# Patient Record
Sex: Male | Born: 1995 | Race: Black or African American | Hispanic: No | Marital: Single | Smoking: Former smoker
Health system: Southern US, Community
[De-identification: ages and names within clinical notes are randomized; demographics above are authoritative.]

---
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
## 2013-03-08 IMAGING — RF DG SHOULDER 2+V*R*
1 series · 4 of 4 positions shown · non-contrast
Comparison: 02/13/2012

CLINICAL DATA: AC joint fixation, instability

RIGHT SHOULDER - 2+ VIEW

[Series 1: run · 4 of 4 slices shown]
[im 1/4]
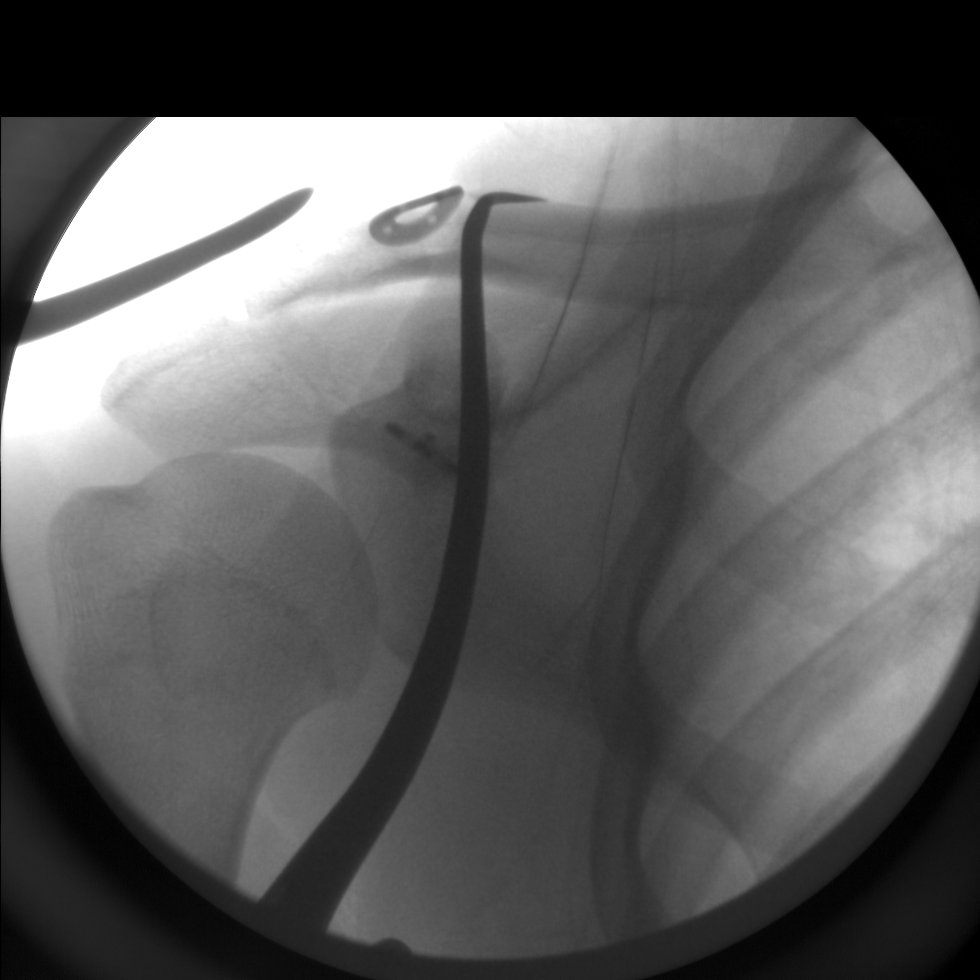
[im 2/4]
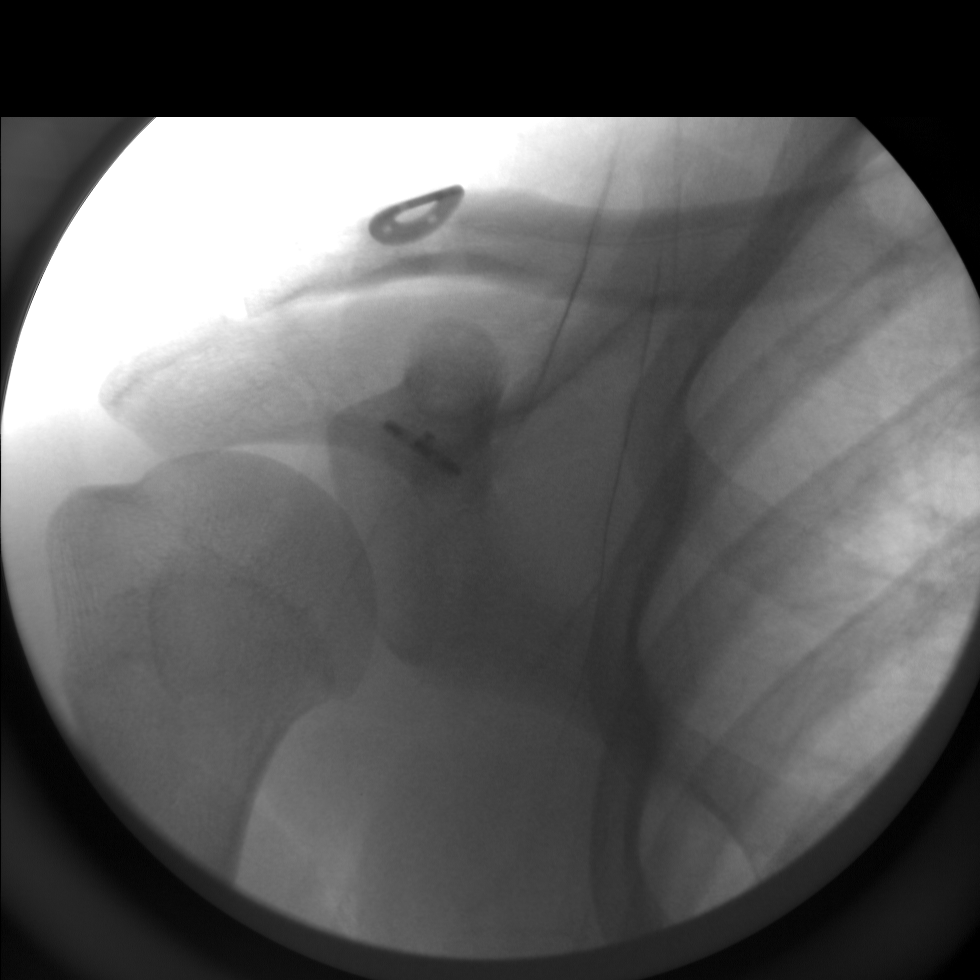
[im 3/4]
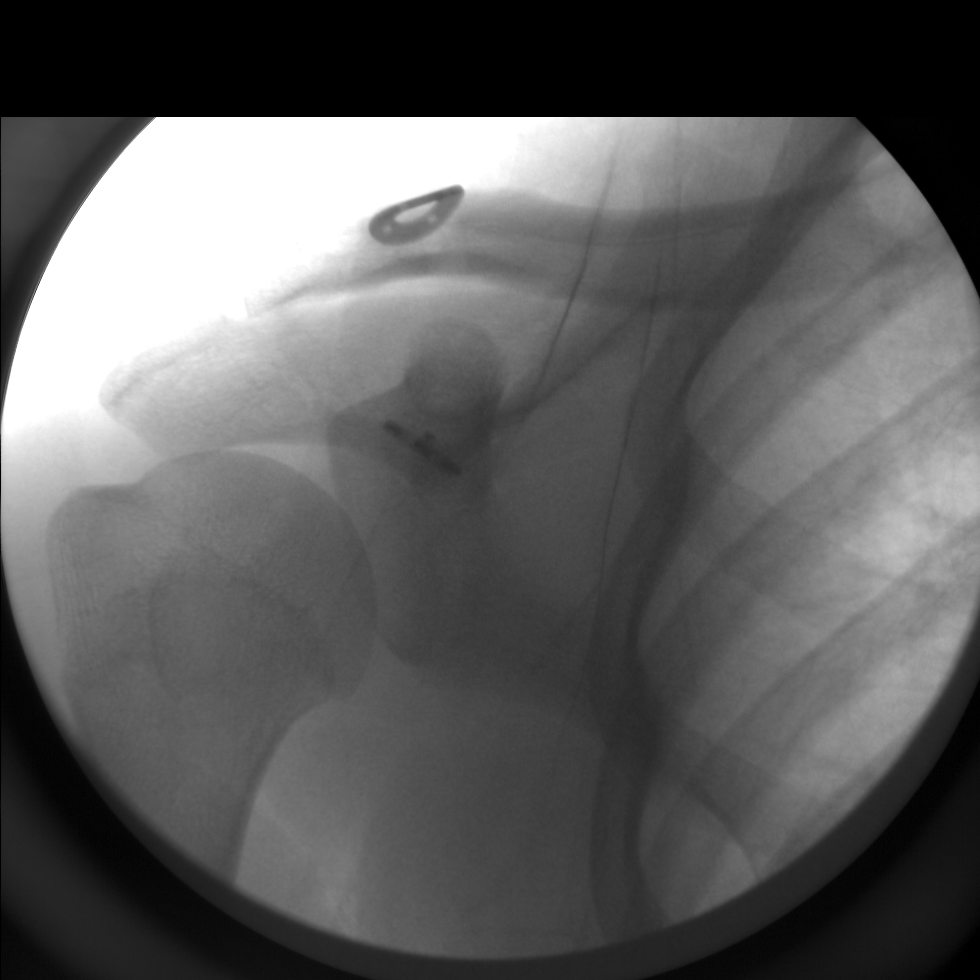
[im 4/4]
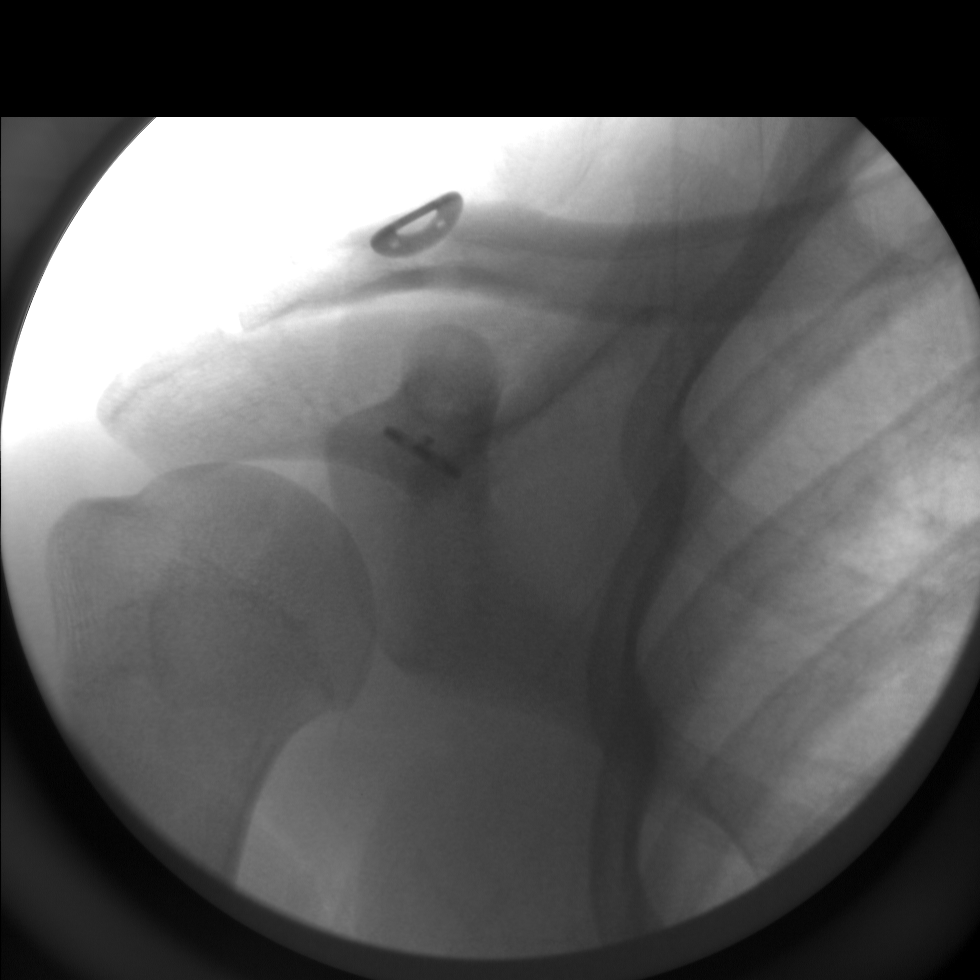

[4 of 4 positions shown; findings below may reference images not displayed]

FINDINGS: Spot fluoroscopic intraoperative views demonstrate
fixation of the distal clavicle to the coracoid process.  AC joint
appears aligned.
IMPRESSION: Limited intraoperative imaging during AC joint stabilization.  AC
joint appears aligned.  No acute fracture.

## 2013-03-08 IMAGING — CR DG SHOULDER 2+V PORT*R*
1 series · 1 of 1 positions shown · non-contrast
Comparison: 02/13/2012

CLINICAL DATA: Postop right AC joint fixation

PORTABLE RIGHT SHOULDER - 2+ VIEW

[view not recorded]
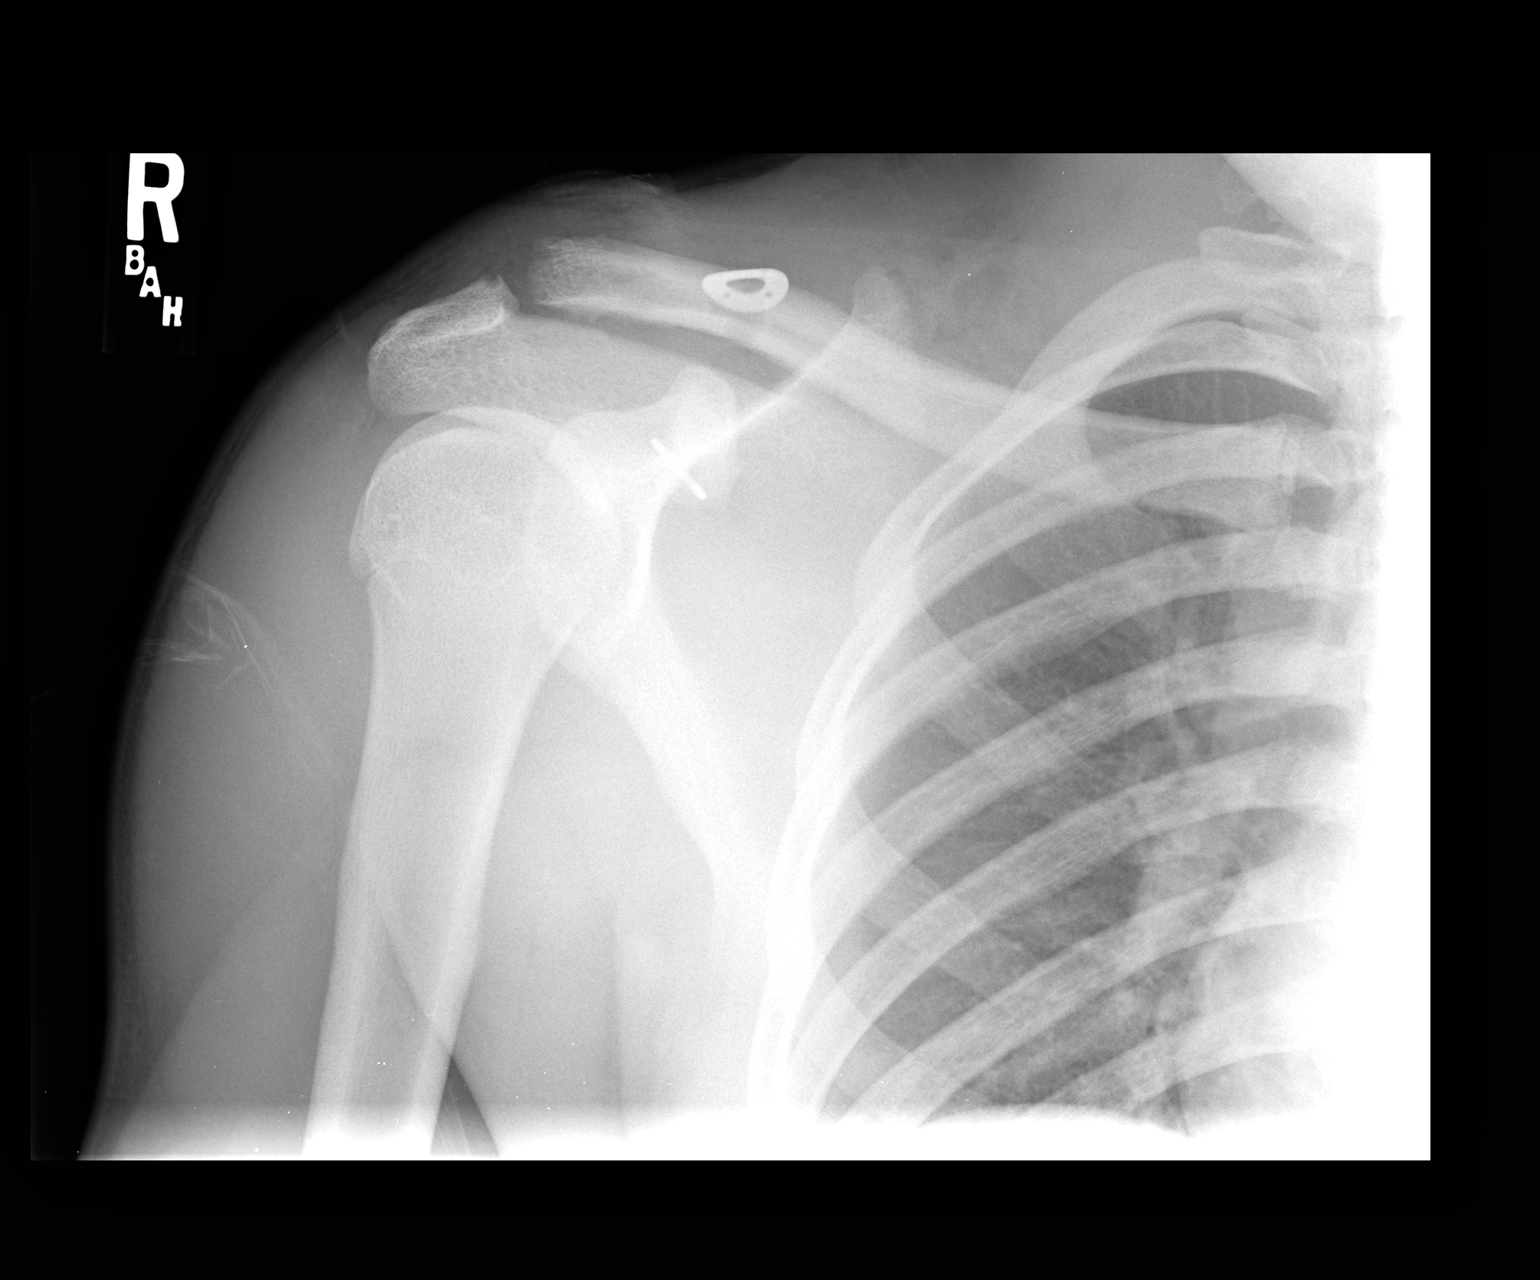

[1 of 1 positions shown; findings below may reference images not displayed]

FINDINGS: Postop changes noted of the right AC joint.  Hardware
noted traversing the distal clavicle to the coracoid process.
Normal developmental changes.  No fracture evident.  Mild soft
tissue swelling overlying the AC joint.
IMPRESSION: Expected postoperative appearance status post AC joint
stabilization.

## 2013-03-08 IMAGING — CR DG CHEST 2V
2 series · 2 of 2 positions shown · non-contrast
Comparison: None

CLINICAL DATA: Preop right shoulder surgery.

CHEST - 2 VIEW

[view not recorded (1 of 2)]
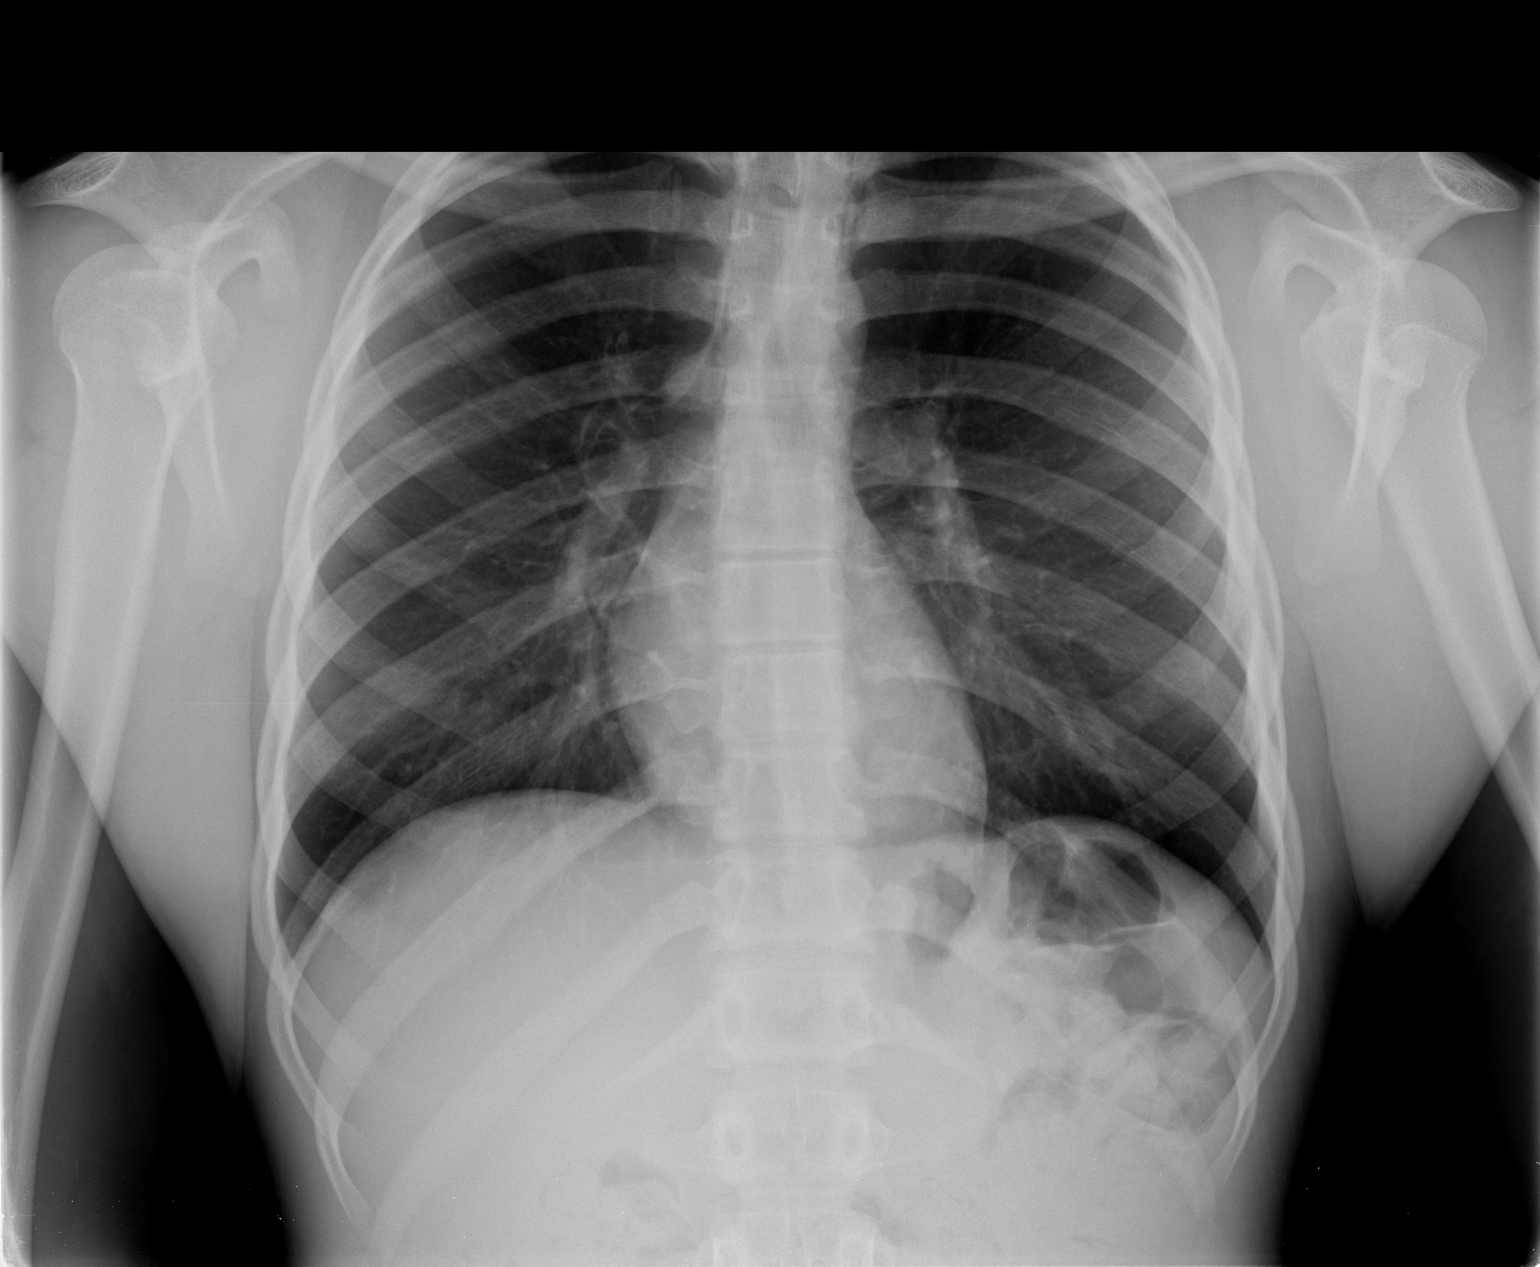

[view not recorded (2 of 2)]
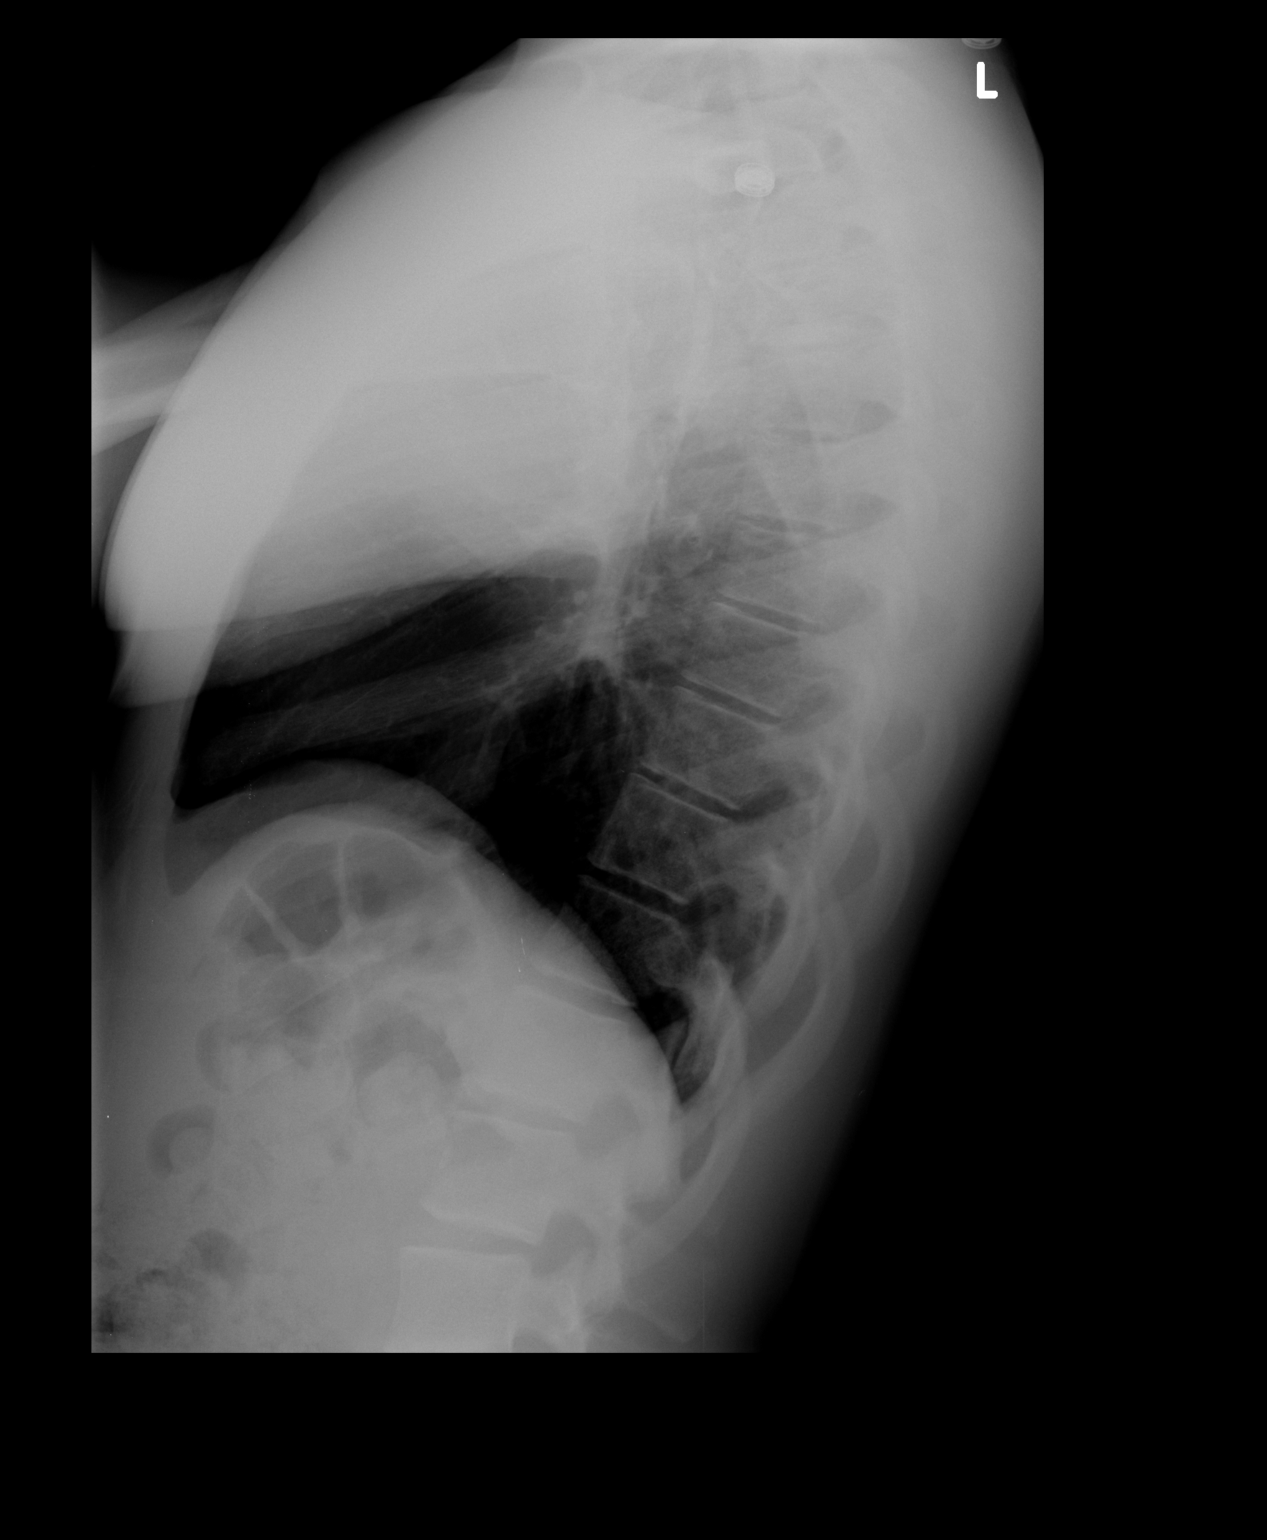

[2 of 2 positions shown; findings below may reference images not displayed]

FINDINGS: Heart and mediastinal contours are within normal limits.
No focal opacities or effusions.  No acute bony abnormality.
IMPRESSION: No active cardiopulmonary disease.

## 2016-05-10 ENCOUNTER — Emergency Department (HOSPITAL_COMMUNITY)
Admission: EM | Admit: 2016-05-10 | Discharge: 2016-05-10 | Disposition: A | Discharge status: Home / Self Care | Payer: BLUE CROSS/BLUE SHIELD | Attending: Emergency Medicine | Admitting: Emergency Medicine

## 2016-05-10 ENCOUNTER — Emergency Department (HOSPITAL_COMMUNITY): Payer: BLUE CROSS/BLUE SHIELD

## 2016-05-10 ENCOUNTER — Encounter (HOSPITAL_COMMUNITY): Payer: Self-pay | Admitting: Emergency Medicine

## 2016-05-10 DIAGNOSIS — T07XXXA Unspecified multiple injuries, initial encounter: Secondary | ICD-10-CM

## 2016-05-10 DIAGNOSIS — S60417A Abrasion of left little finger, initial encounter: Secondary | ICD-10-CM | POA: Insufficient documentation

## 2016-05-10 DIAGNOSIS — S60412A Abrasion of right middle finger, initial encounter: Secondary | ICD-10-CM | POA: Insufficient documentation

## 2016-05-10 DIAGNOSIS — Y998 Other external cause status: Secondary | ICD-10-CM | POA: Insufficient documentation

## 2016-05-10 DIAGNOSIS — Y9289 Other specified places as the place of occurrence of the external cause: Secondary | ICD-10-CM | POA: Insufficient documentation

## 2016-05-10 DIAGNOSIS — M79643 Pain in unspecified hand: Secondary | ICD-10-CM

## 2016-05-10 DIAGNOSIS — Z87891 Personal history of nicotine dependence: Secondary | ICD-10-CM | POA: Insufficient documentation

## 2016-05-10 DIAGNOSIS — S60221A Contusion of right hand, initial encounter: Secondary | ICD-10-CM

## 2016-05-10 DIAGNOSIS — Y9389 Activity, other specified: Secondary | ICD-10-CM | POA: Insufficient documentation

## 2016-05-10 MED ORDER — HYDROCODONE-ACETAMINOPHEN 5-325 MG PO TABS: 1.0000 | ORAL_TABLET | Freq: Four times a day (QID) | ORAL | Status: AC | PRN

## 2016-05-10 MED ORDER — NAPROXEN 500 MG PO TABS: 500.0000 mg | ORAL_TABLET | Freq: Two times a day (BID) | ORAL | Status: AC | PRN

## 2016-05-10 MED ORDER — AMOXICILLIN-POT CLAVULANATE 875-125 MG PO TABS: 1.0000 | ORAL_TABLET | Freq: Two times a day (BID) | ORAL | Status: AC

## 2016-05-10 MED ORDER — AMOXICILLIN-POT CLAVULANATE 875-125 MG PO TABS
1.0000 | ORAL_TABLET | Freq: Once | ORAL | Status: AC
  Administered 2016-05-10: 1 via ORAL
  Filled 2016-05-10: qty 1

## 2016-05-10 MED ORDER — BACITRACIN ZINC 500 UNIT/GM EX OINT
1.0000 "application " | TOPICAL_OINTMENT | Freq: Two times a day (BID) | CUTANEOUS | Status: DC
  Administered 2016-05-10: 1 via TOPICAL
  Filled 2016-05-10: qty 1.8

## 2016-05-10 MED ORDER — HYDROCODONE-ACETAMINOPHEN 5-325 MG PO TABS
2.0000 | ORAL_TABLET | Freq: Once | ORAL | Status: AC
  Administered 2016-05-10: 2 via ORAL
  Filled 2016-05-10: qty 2
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
## 2016-05-10 NOTE — Discharge Instructions (Signed)
Take Augmentin as prescribed until finished. Take Naproxen for pain. Ice your hands 3-4 times per day for 15-20 minutes each time. You may take Norco for severe pain. Follow up with a hand specialist for wound recheck if symptoms persist. You may return, as needed, if symptoms worsen.  Human Bite Human bite wounds tend to become infected, even when they seem minor at first. Infection can develop quickly, sometimes in a matter of hours. Bite wounds of the hand have a higher chance of infection compared to bites in other places and can be serious because the tendons and joints are close to the skin. SYMPTOMS  Common symptoms of a human bite include:   Bruising.   Broken skin.  Bleeding.  Pain. DIAGNOSIS  This condition may be diagnosed based on a physical exam and medical history. Your health care provider will examine the wound and ask for details about how the bite happened. If you have details about the medical history of the person who bit you, it is important to tell your health care provider. This will help determine if there is any chance that a disease may have been spread. You may have tests, such as:   Blood tests. This may be done if there is a chance of infection from diseases such as hepatitis or HIV.  X-rays to check for damage to bones or joints.  Culture test. This uses a sample of fluid from the wound to check for infection. TREATMENT  Treatment varies based on the location and severity of the bite and your medical history. Treatment may include:   Wound care. This often includes cleaning the wound, flushing the wound with saline solution, and applying a bandage (dressing). Sometimes, the wound is left open to heal because of the high risk of infection. However, in some cases, the wound may be closed with stitches (sutures), staples, skin glue, or adhesive strips.  Antibiotic medicine.  A flexible cast (splint).  Tetanus shot. In some cases, bites that have become  infected may require IV antibiotics and surgical treatment in the hospital.  HOME CARE INSTRUCTIONS Wound Care  Follow instructions from your health care provider about how to take care of your wound. Make sure you:  Wash your hands with soap and water before you change your dressing. If soap and water are not available, use hand sanitizer.  Change your dressing as told by your health care provider.  Leave sutures, skin glue, or adhesive strips in place. These skin closures may need to be in place for 2 weeks or longer. If adhesive strip edges start to loosen and curl up, you may trim the loose edges. Do not remove adhesive strips completely unless your health care provider tells you to do that.  Check your wound every day for signs of infection. Watch for:  Increasing redness, swelling, or pain.  Fluid, blood, or pus. General Instructions  Take or apply over-the-counter and prescription medicines only as told by your health care provider.  If you were prescribed an antibiotic, take or apply it as told by your health care provider. Do not stop using the antibiotic even if your condition improves.  Keep the injured area raised (elevated) above the level of your heart while you are sitting or lying down, if this is possible.  If directed, apply ice to the injured area:  Put ice in a plastic bag.  Place a towel between your skin and the bag.  Leave the ice on for 20 minutes, 2-3  times per day.  Keep all follow-up visits as told by your health care provider. This is important. SEEK MEDICAL CARE IF:  You have chills.   You have pain when you move your injured area.  You have trouble moving your injured area.   You are not improving, or you are getting worse.  SEEK IMMEDIATE MEDICAL CARE IF:  You have increasing fluid, blood, or pus coming from your wound.  You have increasing redness, swelling, or pain at the site of your wound.   You have a red streak extending  away from your wound.  You have a fever.    This information is not intended to replace advice given to you by your health care provider. Make sure you discuss any questions you have with your health care provider.   Document Released: 01/04/2005 Document Revised: 08/18/2015 Document Reviewed: 04/14/2015 Elsevier Interactive Patient Education 2016 [employer]   Cryotherapy Cryotherapy means treatment with cold. Ice or gel packs can be used to reduce both pain and swelling. Ice is the most helpful within the first 24 to 48 hours after an injury or flare-up from overusing a muscle or joint. Sprains, strains, spasms, burning pain, shooting pain, and aches can all be eased with ice. Ice can also be used when recovering from surgery. Ice is effective, has very few side effects, and is safe for most people to use. PRECAUTIONS  Ice is not a safe treatment option for people with:  Raynaud phenomenon. This is a condition affecting small blood vessels in the extremities. Exposure to cold may cause your problems to return.  Cold hypersensitivity. There are many forms of cold hypersensitivity, including:  Cold urticaria. Red, itchy hives appear on the skin when the tissues begin to warm after being iced.  Cold erythema. This is a red, itchy rash caused by exposure to cold.  Cold hemoglobinuria. Red blood cells break down when the tissues begin to warm after being iced. The hemoglobin that carry oxygen are passed into the urine because they cannot combine with blood proteins fast enough.  Numbness or altered sensitivity in the area being iced. If you have any of the following conditions, do not use ice until you have discussed cryotherapy with your caregiver:  Heart conditions, such as arrhythmia, angina, or chronic heart disease.  High blood pressure.  Healing wounds or open skin in the area being iced.  Current infections.  Rheumatoid arthritis.  Poor circulation.  Diabetes. Ice  slows the blood flow in the region it is applied. This is beneficial when trying to stop inflamed tissues from spreading irritating chemicals to surrounding tissues. However, if you expose your skin to cold temperatures for too long or without the proper protection, you can damage your skin or nerves. Watch for signs of skin damage due to cold. HOME CARE INSTRUCTIONS Follow these tips to use ice and cold packs safely.  Place a dry or damp towel between the ice and skin. A damp towel will cool the skin more quickly, so you may need to shorten the time that the ice is used.  For a more rapid response, add gentle compression to the ice.  Ice for no more than 10 to 20 minutes at a time. The bonier the area you are icing, the less time it will take to get the benefits of ice.  Check your skin after 5 minutes to make sure there are no signs of a poor response to cold or skin damage.  Rest 20  minutes or more between uses.  Once your skin is numb, you can end your treatment. You can test numbness by very lightly touching your skin. The touch should be so light that you do not see the skin dimple from the pressure of your fingertip. When using ice, most people will feel these normal sensations in this order: cold, burning, aching, and numbness.  Do not use ice on someone who cannot communicate their responses to pain, such as small children or people with dementia. HOW TO MAKE AN ICE PACK Ice packs are the most common way to use ice therapy. Other methods include ice massage, ice baths, and cryosprays. Muscle creams that cause a cold, tingly feeling do not offer the same benefits that ice offers and should not be used as a substitute unless recommended by your caregiver. To make an ice pack, do one of the following:  Place crushed ice or a bag of frozen vegetables in a sealable plastic bag. Squeeze out the excess air. Place this bag inside another plastic bag. Slide the bag into a pillowcase or place a  damp towel between your skin and the bag.  Mix 3 parts water with 1 part rubbing alcohol. Freeze the mixture in a sealable plastic bag. When you remove the mixture from the freezer, it will be slushy. Squeeze out the excess air. Place this bag inside another plastic bag. Slide the bag into a pillowcase or place a damp towel between your skin and the bag. SEEK MEDICAL CARE IF:  You develop white spots on your skin. This may give the skin a blotchy (mottled) appearance.  Your skin turns blue or pale.  Your skin becomes waxy or hard.  Your swelling gets worse. MAKE SURE YOU:   Understand these instructions.  Will watch your condition.  Will get help right away if you are not doing well or get worse.   This information is not intended to replace advice given to you by your health care provider. Make sure you discuss any questions you have with your health care provider.   Document Released: 07/24/2011 Document Revised: 12/18/2014 Document Reviewed: 07/24/2011 Elsevier Interactive Patient Education [employer]
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
## 2017-01-10 ENCOUNTER — Telehealth (INDEPENDENT_AMBULATORY_CARE_PROVIDER_SITE_OTHER): Payer: Self-pay | Admitting: Orthopedic Surgery

## 2017-01-10 NOTE — Telephone Encounter (Signed)
Patient called left message requesting a copy of records.  I called him back @ [phone] spoke with him. He needs his records mailed to him. I verifed his DOB & address and advised that I would mail his records per his request

## 2019-10-03 ENCOUNTER — Encounter (HOSPITAL_COMMUNITY): Payer: Self-pay

## 2019-10-03 ENCOUNTER — Other Ambulatory Visit: Payer: Self-pay

## 2019-10-03 ENCOUNTER — Emergency Department (HOSPITAL_COMMUNITY)
Admission: EM | Admit: 2019-10-03 | Discharge: 2019-10-03 | Disposition: A | Discharge status: Home / Self Care | Payer: Worker's Compensation | Attending: Emergency Medicine | Admitting: Emergency Medicine

## 2019-10-03 ENCOUNTER — Emergency Department (HOSPITAL_COMMUNITY): Payer: Self-pay | Attending: Emergency Medicine

## 2019-10-03 DIAGNOSIS — S61212A Laceration without foreign body of right middle finger without damage to nail, initial encounter: Secondary | ICD-10-CM | POA: Insufficient documentation

## 2019-10-03 DIAGNOSIS — W231XXA Caught, crushed, jammed, or pinched between stationary objects, initial encounter: Secondary | ICD-10-CM | POA: Diagnosis not present

## 2019-10-03 DIAGNOSIS — Z87891 Personal history of nicotine dependence: Secondary | ICD-10-CM | POA: Diagnosis not present

## 2019-10-03 DIAGNOSIS — Y929 Unspecified place or not applicable: Secondary | ICD-10-CM | POA: Diagnosis not present

## 2019-10-03 DIAGNOSIS — S6991XA Unspecified injury of right wrist, hand and finger(s), initial encounter: Secondary | ICD-10-CM

## 2019-10-03 DIAGNOSIS — Y990 Civilian activity done for income or pay: Secondary | ICD-10-CM | POA: Insufficient documentation

## 2019-10-03 DIAGNOSIS — Z23 Encounter for immunization: Secondary | ICD-10-CM | POA: Diagnosis not present

## 2019-10-03 DIAGNOSIS — Y939 Activity, unspecified: Secondary | ICD-10-CM | POA: Insufficient documentation

## 2019-10-03 MED ORDER — TETANUS-DIPHTH-ACELL PERTUSSIS 5-2.5-18.5 LF-MCG/0.5 IM SUSP
0.5000 mL | Freq: Once | INTRAMUSCULAR | Status: AC
  Administered 2019-10-03: 11:00:00 0.5 mL via INTRAMUSCULAR
  Filled 2019-10-03: qty 0.5

## 2019-10-03 MED ORDER — BACITRACIN ZINC 500 UNIT/GM EX OINT
1.0000 | TOPICAL_OINTMENT | Freq: Two times a day (BID) | CUTANEOUS | 0 refills | Status: AC
Start: 2019-10-03 — End: ?

## 2019-10-03 MED ORDER — BACITRACIN ZINC 500 UNIT/GM EX OINT
TOPICAL_OINTMENT | Freq: Once | CUTANEOUS | Status: AC
  Administered 2019-10-03: 1 via TOPICAL
  Filled 2019-10-03: qty 0.9

## 2019-10-03 MED ORDER — ACETAMINOPHEN 500 MG PO TABS
1000.0000 mg | ORAL_TABLET | Freq: Once | ORAL | Status: AC
  Administered 2019-10-03: 11:00:00 1000 mg via ORAL
  Filled 2019-10-03: qty 2

## 2019-10-03 MED ORDER — NAPROXEN 500 MG PO TABS
500.0000 mg | ORAL_TABLET | Freq: Once | ORAL | Status: AC
  Administered 2019-10-03: 11:00:00 500 mg via ORAL
  Filled 2019-10-03: qty 1
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
## 2019-10-03 NOTE — ED Notes (Signed)
Discharge instructions reviewed with patient. Patient verbalizes understanding. VSS.   

## 2019-10-03 NOTE — Discharge Instructions (Signed)
It was my pleasure taking care of you today!   Keep wound clean and dry.   Tylenol and / or ibuprofen as needed for pain.   Return to ER for new or worsening symptoms, any additional concerns.
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
## 2021-03-12 ENCOUNTER — Other Ambulatory Visit: Payer: Self-pay

## 2021-03-12 ENCOUNTER — Emergency Department (HOSPITAL_COMMUNITY)
Admission: EM | Admit: 2021-03-12 | Discharge: 2021-03-12 | Disposition: A | Discharge status: Home / Self Care | Payer: Self-pay | Attending: Emergency Medicine | Admitting: Emergency Medicine

## 2021-03-12 ENCOUNTER — Encounter (HOSPITAL_COMMUNITY): Payer: Self-pay | Admitting: Emergency Medicine

## 2021-03-12 DIAGNOSIS — Z5321 Procedure and treatment not carried out due to patient leaving prior to being seen by health care provider: Secondary | ICD-10-CM | POA: Insufficient documentation

## 2021-03-12 DIAGNOSIS — Z28310 Unvaccinated for covid-19: Secondary | ICD-10-CM | POA: Insufficient documentation

## 2021-03-12 DIAGNOSIS — R6883 Chills (without fever): Secondary | ICD-10-CM | POA: Insufficient documentation

## 2021-03-12 DIAGNOSIS — R519 Headache, unspecified: Secondary | ICD-10-CM | POA: Insufficient documentation

## 2021-03-12 DIAGNOSIS — Z20822 Contact with and (suspected) exposure to covid-19: Secondary | ICD-10-CM | POA: Insufficient documentation

## 2021-03-12 LAB — RESP PANEL BY RT-PCR (FLU A&B, COVID) ARPGX2
Influenza A by PCR: NEGATIVE
Influenza B by PCR: NEGATIVE
SARS Coronavirus 2 by RT PCR: NEGATIVE

## 2021-03-12 MED ORDER — ONDANSETRON 4 MG PO TBDP
4.0000 mg | ORAL_TABLET | Freq: Once | ORAL | Status: AC
  Administered 2021-03-12: 4 mg via ORAL
  Filled 2021-03-12: qty 1

## 2021-03-12 MED ORDER — ACETAMINOPHEN 325 MG PO TABS
650.0000 mg | ORAL_TABLET | Freq: Once | ORAL | Status: AC
  Administered 2021-03-12: 650 mg via ORAL
  Filled 2021-03-12: qty 2

## 2021-03-12 NOTE — ED Triage Notes (Addendum)
C/o headache and chills that started today while at work.  Not vaccinated for COVID and no known sick contacts.  No neuro deficits noted.

## 2021-03-12 NOTE — ED Provider Notes (Addendum)
Patient placed in Quick Look pathway, seen and evaluated   Chief Complaint: headache and chills  HPI:  25 y/o M presenting for eval of a gradual onset headache and chills that started at work [employer]. Reports 2 episodes of vomiting. He is no longer vomiting or nauseated and has been able to eat crackers since then. Denies diarrhea, abd pain, uri sxs.  ROS: headache, chills, no diarrhea, abd pain, uri sxs (one)  Physical Exam:   Gen: No distress  Neuro: Awake and Alert  Skin: Warm    Focused Exam: abd soft and nontender, cranial II-XII, 5/5 strength to BUE/BLE   Initiation of care has begun. The patient has been counseled on the process, plan, and necessity for staying for the completion/evaluation, and the remainder of the medical screening examination  MSE was initiated and I personally evaluated the patient and placed orders (if any) at  12:43 PM on March 12, 2021.  The patient appears stable so that the remainder of the MSE may be completed by another provider.    Karrie Meres, PA-C 03/12/21 1240    [address], Askari Kinley S, PA-C 03/12/21 1243    Gwyneth Sprout, MD 03/18/21 2325

## 2021-03-12 NOTE — ED Notes (Signed)
Pt decided to leave due to wait 

## 2021-04-10 ENCOUNTER — Emergency Department (HOSPITAL_COMMUNITY): Admission: EM | Admit: 2021-04-10 | Discharge: 2021-04-10 | Discharge status: Against Medical Advice | Payer: Self-pay

## 2021-04-10 NOTE — ED Notes (Signed)
Patient called for triage x3, no answer.
